# Patient Record
Sex: Male | Born: 1975 | Race: Black or African American | Hispanic: No | Marital: Married | Smoking: Current every day smoker
Health system: Southern US, Community
[De-identification: ages and names within clinical notes are randomized; demographics above are authoritative.]

---

## 2005-12-29 ENCOUNTER — Emergency Department (HOSPITAL_COMMUNITY): Admission: EM | Admit: 2005-12-29 | Discharge: 2005-12-29 | Payer: Self-pay | Admitting: Emergency Medicine
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
## 2014-01-02 ENCOUNTER — Encounter: Payer: Self-pay | Admitting: Physical Medicine & Rehabilitation

## 2014-02-03 ENCOUNTER — Ambulatory Visit (HOSPITAL_BASED_OUTPATIENT_CLINIC_OR_DEPARTMENT_OTHER): Payer: Worker's Compensation | Admitting: Physical Medicine & Rehabilitation

## 2014-02-03 ENCOUNTER — Encounter: Payer: Self-pay | Admitting: Physical Medicine & Rehabilitation

## 2014-02-03 ENCOUNTER — Other Ambulatory Visit: Payer: Self-pay | Admitting: Physical Medicine & Rehabilitation

## 2014-02-03 ENCOUNTER — Encounter: Payer: Worker's Compensation | Attending: Physical Medicine & Rehabilitation

## 2014-02-03 DIAGNOSIS — Z5181 Encounter for therapeutic drug level monitoring: Secondary | ICD-10-CM | POA: Insufficient documentation

## 2014-02-03 DIAGNOSIS — M545 Low back pain: Secondary | ICD-10-CM | POA: Insufficient documentation

## 2014-02-03 DIAGNOSIS — Z79899 Other long term (current) drug therapy: Secondary | ICD-10-CM | POA: Insufficient documentation

## 2014-02-03 DIAGNOSIS — M533 Sacrococcygeal disorders, not elsewhere classified: Secondary | ICD-10-CM | POA: Diagnosis present

## 2014-02-03 DIAGNOSIS — G8929 Other chronic pain: Secondary | ICD-10-CM | POA: Insufficient documentation

## 2014-02-03 MED ORDER — CYCLOBENZAPRINE HCL 10 MG PO TABS: 10.0000 mg | ORAL_TABLET | Freq: Every day | ORAL | Status: AC

## 2014-02-03 NOTE — Progress Notes (Signed)
Subjective:    Patient ID: Roy Porter, male    DOB: 02/27/1976, 38 y.o.   MRN: [id number] Work-related injury Date of injury 07/20/2013 Chief complaint low back pain HPI Slipped and fell at work. He went to occupational medicine the following day. Patient hit his hip against the floor started on diclofenac and Flexeril x-rays performed of the hip and low back. Left hip x-ray was normal. Was taken off of work for several days. Reevaluated by occupational medicine. No sciatic symptoms were noted. Referred to orthopedic surgery. Also was sent to physical therapy. X-ray of the lumbar spine was normal. Orthopedic evaluation from 10/09/2013 reviewed. Therapy was about complete by that time. Was allowed light duty work. Continued on cyclobenzaprine. MRI of the lumbar spine was ordered by orthopedic surgery. Demonstrating normal levels T12-L5. There was mild disc desiccation small annular fissure at L5-S1. Small central disc protrusion abutting right S1 nerve root. Underwent FCE on 11/14/2013. Tested to light physical demand level 20 pound lifting floor to waist 20 pounds waist to shoulder carry 30 pounds which 107 pounds pulse 61 pounds. Aerobic functional capacity 2.65 Mets. Accurate FCE.  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 Carries hands weighing about 15 pounds.  Patient complains of numbness in both legs. He also complains of arm numbness which did discuss that I can only treat for low back related issues.   Past medical history negative for prior back issues, negative for psoriasis, negative for bowel or bladder issues  Past surgical history negative for back surgeries in the past, no hip or knee surgeries.  Standing tolerance 2 hr Pain Inventory Average Pain 8 Pain Right Now 8 My pain is intermittent, sharp, stabbing, tingling and aching  In the last 24 hours, has pain interfered with the following? General activity 6 Relation with others 6 Enjoyment of life 6 What TIME of  day is your pain at its worst? morning and night Sleep (in general) Poor  Pain is worse with: walking, bending, sitting, standing and some activites Pain improves with: gets no relief Relief from Meds: 0  Mobility walk without assistance how many minutes can you walk? 60 minutes ability to climb steps?  yes do you drive?  yes  Function not employed: date last employed 08/10/2013  Neuro/Psych weakness numbness tingling spasms  Prior Studies Any changes since last visit?  yes  Physicians involved in your care Any changes since last visit?  yes   History reviewed. No pertinent family history. History   Social History  . Marital Status: Married    Spouse Name: N/A    Number of Children: N/A  . Years of Education: N/A   Social History Main Topics  . Smoking status: Former Smoker -- 0.50 packs/day for 22 years    Types: Cigarettes  . Smokeless tobacco: Never Used  . Alcohol Use: None  . Drug Use: None  . Sexual Activity: None   Other Topics Concern  . None   Social History Narrative  . None   History reviewed. No pertinent past surgical history. History reviewed. No pertinent past medical history. Pulse 86  Resp 14  Ht 5\' 10"  (1.778 m)  Wt 223 lb (101.152 kg)  BMI 32.00 kg/m2  SpO2 98%  Opioid Risk Score:   Fall Risk Score: Low Fall Risk (0-5 points)  Review of Systems     Objective:   Physical Exam  Constitutional: He is oriented to person, place, and time. He appears well-developed and well-nourished.  Musculoskeletal:  Right hip: He exhibits decreased range of motion. He exhibits no tenderness and no deformity.       Left hip: He exhibits decreased range of motion. He exhibits normal strength and no deformity.       Lumbar back: He exhibits decreased range of motion and tenderness. He exhibits no deformity and no spasm.  Neurological: He is alert and oriented to person, place, and time. He has normal strength and normal reflexes. He displays  no atrophy. No sensory deficit. Gait normal.  Reflex Scores:      Patellar reflexes are 2+ on the right side and 2+ on the left side.      Achilles reflexes are 2+ on the right side and 2+ on the left side. Psychiatric: He has a normal mood and affect.  Nursing note and vitals reviewed.  Positive Fabere maneuver on the right side in the right SI joint area Negative straight leg raising test Sensation intact to pinprick and proprioception in both lower limbs Ambulates without evidence of toe drag or knee instability. Alternate sitting with standing during visit       Assessment & Plan:  1. Chronic low back pain 7 months after a fall at work. MRI showing some degenerative disc. No clear cut signs of radiculopathy. Has vague symptoms of numbness but no sensory loss on examination. 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 This could've been caused by the fall. He has no other risk factors for sacroiliitis such as inflammatory bowel disease or psoriasis.  Recommend sacroiliac injection. We will assess efficacy. If helpful may need to repeat and even proceed onto sacroiliac radiofrequency.  If Sacroiliac injection not helpful Would recommend right S1 transforaminal injection under fluoroscopic guidance. If this is helpful may need to repeat.  If right S1 transforaminal injection is not helpful then would proceed to right L3 L4 L5 medial branch blocks under fluoroscopic guidance.  Sleep disturbance secondary to chronic pain we'll prescribe cyclobenzaprine 10 mg daily at bedtime. May need to increase to 20 if this is not helpful  Discussed with patient, his wife as well as his case Production designer, theatre/television/filmmanager. Wayne Severara Ramos fax [phone]  Case manager Joellen JerseyMary Baldwin Fax [phone]

## 2014-02-03 NOTE — Patient Instructions (Signed)
Prescribed cyclobenzaprine 10 mg daily at bedtime to help with muscle spasms as well as sleep  First injection right sacroiliac joint  If helpful may need to repeat  If not helpful recommend right S1 transforaminal epidural steroid injection  If helpful may need to repeat  If not helpful recommend right L3, L4, L5 medial branch blocks

## 2014-02-04 LAB — PMP ALCOHOL METABOLITE (ETG): ETGU: NEGATIVE ng/mL

## 2014-02-05 LAB — COCAINE METABOLITE (GC/LC/MS), URINE: BENZOYLECGONINE GC/MS CONF: 527 ng/mL — AB (ref <100)

## 2014-02-06 LAB — PRESCRIPTION MONITORING PROFILE (SOLSTAS)
Amphetamine/Meth: NEGATIVE ng/mL
BUPRENORPHINE, URINE: NEGATIVE ng/mL
Barbiturate Screen, Urine: NEGATIVE ng/mL
Benzodiazepine Screen, Urine: NEGATIVE ng/mL
CANNABINOID SCRN UR: NEGATIVE ng/mL
CREATININE, URINE: 205.99 mg/dL (ref >20.0)
Carisoprodol, Urine: NEGATIVE ng/mL
ECSTASY: NEGATIVE ng/mL
FENTANYL URINE: NEGATIVE ng/mL
MEPERIDINE UR: NEGATIVE ng/mL
METHADONE SCREEN, URINE: NEGATIVE ng/mL
NITRITES URINE, INITIAL: NEGATIVE ug/mL
Opiate Screen, Urine: NEGATIVE ng/mL
Oxycodone Screen, Ur: NEGATIVE ng/mL
PROPOXYPHENE: NEGATIVE ng/mL
Tapentadol, urine: NEGATIVE ng/mL
Tramadol Scrn, Ur: NEGATIVE ng/mL
Zolpidem, Urine: NEGATIVE ng/mL
pH, Initial: 6.8 pH (ref 4.5–8.9)

## 2014-02-20 ENCOUNTER — Telehealth: Payer: Self-pay | Admitting: *Deleted

## 2014-02-20 NOTE — Telephone Encounter (Signed)
Notified Mr Kathlen BrunswickLipscomb that due to the + cocaine in his UDS we will not prescribe medications for him but Dr Wynn BankerKirsteins will do injections only on him.

## 2014-02-24 ENCOUNTER — Ambulatory Visit: Payer: Worker's Compensation | Admitting: Physical Medicine & Rehabilitation

## 2014-03-03 ENCOUNTER — Ambulatory Visit: Payer: Worker's Compensation | Admitting: Physical Medicine & Rehabilitation

## 2014-03-10 ENCOUNTER — Encounter: Payer: Self-pay | Admitting: Physical Medicine & Rehabilitation

## 2014-03-10 ENCOUNTER — Encounter: Payer: Worker's Compensation | Attending: Physical Medicine & Rehabilitation

## 2014-03-10 ENCOUNTER — Ambulatory Visit (HOSPITAL_BASED_OUTPATIENT_CLINIC_OR_DEPARTMENT_OTHER): Payer: Worker's Compensation | Admitting: Physical Medicine & Rehabilitation

## 2014-03-10 VITALS — BP 140/86 | HR 92 | Resp 14 | Ht 70.0 in | Wt 224.0 lb

## 2014-03-10 DIAGNOSIS — M533 Sacrococcygeal disorders, not elsewhere classified: Secondary | ICD-10-CM

## 2014-03-10 NOTE — Patient Instructions (Signed)
Sacroiliac injection was performed today. A combination of a naming medicine plus a cortisone medicine was injected. The injection was done under x-ray guidance. This procedure has been performed to help reduce low back and buttocks pain as well as potentially hip pain. The duration of this injection is variable lasting from hours to  Months. It may repeated if needed.  May resume work tomorrow  If this is not helpful next step is Right S 1 transforaminal Epidural injection

## 2014-03-10 NOTE — Progress Notes (Signed)
Right sacroiliac injection under fluoroscopic guidance  Indication: Right Low back and buttocks pain not relieved by medication management and other conservative care.  Informed consent was obtained after describing risks and benefits of the procedure with the patient, this includes bleeding, bruising, infection, paralysis and medication side effects. The patient wishes to proceed and has given written consent. The patient was placed in a prone position. The lumbar and sacral area was marked and prepped with Betadine. A 25-gauge 1-1/2 inch needle was inserted into the skin and subcutaneous tissue and 1 mL of 1% lidocaine was injected. Then a 25-gauge 3 inch spinal needle was inserted under fluoroscopic guidance into the Right sacroiliac joint. AP and lateral images were utilized. Omnipaque 180x0.5 mL under live fluoroscopy demonstrated no intravascular uptake. Then a solution containing one ML of 6 mgper ml betamethasone and 2 ML of 1% lidocaine MPF was injected x1.5 mL. Patient tolerated the procedure well. Post procedure instructions were given. Please see post procedure form.  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 His work restrictions have been previously determined by [employer]. Continue current pain medication which is cyclobenzaprine 10 mg Daily at bedtime when necessary  Also discussed next steps in terms of this procedure. If this procedure was helpful but only short-term would repeat, if not helpful at all consider right S1 transforaminal epidural steroid injection  Return to clinic 5 weeks

## 2014-03-10 NOTE — Progress Notes (Signed)
  PROCEDURE RECORD [facility]   Name: Roy Porter DOB:02/09/1976 MRN: [id number]  Date:03/10/2014  Physician: Claudette LawsAndrew Kirsteins, MD    Nurse/CMA: Jenel Gierke  Allergies: KNDA  Consent Signed: Yes.    Is patient diabetic? No.  CBG today? .  Pregnant: No. LMP: No LMP for male patient. (age 618-55)  Anticoagulants: no Anti-inflammatory: no Antibiotics: no  Procedure: Rigth Sacroiliac Inject Position: Prone 6Start Time: 4:05 End Time: 4:09 Fluoro Time: 8  RN/CMA Draiden Mirsky Wing Schoch    Time 3:45pm 4:11pm    BP 144/80 140/80    Pulse 88 76    Respirations 14 14    O2 Sat 99 99    S/S 6 6    Pain Level 6/10 5/10     D/C home with wife, patient A & O X 3, D/C instructions reviewed, and sits independently.

## 2014-04-17 ENCOUNTER — Ambulatory Visit: Payer: Self-pay | Admitting: Physical Medicine & Rehabilitation

## 2014-04-21 ENCOUNTER — Ambulatory Visit (HOSPITAL_BASED_OUTPATIENT_CLINIC_OR_DEPARTMENT_OTHER): Payer: Worker's Compensation | Admitting: Physical Medicine & Rehabilitation

## 2014-04-21 ENCOUNTER — Encounter: Payer: Self-pay | Admitting: Physical Medicine & Rehabilitation

## 2014-04-21 ENCOUNTER — Encounter: Payer: Worker's Compensation | Attending: Physical Medicine & Rehabilitation

## 2014-04-21 VITALS — BP 125/91 | HR 82 | Resp 14

## 2014-04-21 DIAGNOSIS — G8929 Other chronic pain: Secondary | ICD-10-CM | POA: Insufficient documentation

## 2014-04-21 DIAGNOSIS — M533 Sacrococcygeal disorders, not elsewhere classified: Secondary | ICD-10-CM | POA: Insufficient documentation

## 2014-04-21 DIAGNOSIS — Z79899 Other long term (current) drug therapy: Secondary | ICD-10-CM | POA: Diagnosis not present

## 2014-04-21 DIAGNOSIS — M545 Low back pain: Secondary | ICD-10-CM | POA: Insufficient documentation

## 2014-04-21 DIAGNOSIS — Z5181 Encounter for therapeutic drug level monitoring: Secondary | ICD-10-CM | POA: Insufficient documentation

## 2014-04-21 MED ORDER — MELOXICAM 7.5 MG PO TABS: 7.5000 mg | ORAL_TABLET | Freq: Every day | ORAL | Status: AC

## 2014-04-21 NOTE — Patient Instructions (Signed)
Sacroiliac injection was performed today. A combination of a naming medicine plus a cortisone medicine was injected. The injection was done under x-ray guidance. This procedure has been performed to help reduce low back and buttocks pain as well as potentially hip pain. The duration of this injection is variable lasting from hours to  Months. It may repeated if needed. 

## 2014-04-21 NOTE — Progress Notes (Signed)
  PROCEDURE RECORD [facility]   Name: Robina Adeerrance Bejarano DOB:02/10/1976 MRN: [id number]  Date:04/21/2014  Physician: Claudette LawsAndrew Kirsteins, MD    Nurse/CMA:Ken Amour Trigg  Allergies: Not on File  Consent Signed: Yes.    Is patient diabetic? No.  CBG today?   Pregnant: No. LMP: No LMP for male patient. (age 39-55)  Anticoagulants: no Anti-inflammatory: no Antibiotics: no  Procedure: Right SI  Steroid injection     Position: Prone Start Time: 3:36 pm End Time: 3:43PM  Fluoro Time: 18  RN/CMA Teresa CoombsKen [place] Ken Tahnee Cifuentes    Time 3:00PM 3:50 pm    BP 125/81 146/79    Pulse 82 75    Respirations 14 14    O2 Sat 97 98    S/S 6 6    Pain Level 7/10 3/10     D/C home with transportation service ( drivers name is Roo, patient A & O X 3, D/C instructions reviewed, and sits independently.

## 2014-04-21 NOTE — Progress Notes (Signed)
Right sacroiliac injection under fluoroscopic guidance  Indication: Right Low back and buttocks pain not relieved by medication management and other conservative care.Last injection gave close to 100% relief for several days.  Informed consent was obtained after describing risks and benefits of the procedure with the patient, this includes bleeding, bruising, infection, paralysis and medication side effects. The patient wishes to proceed and has given written consent. The patient was placed in a prone position. The lumbar and sacral area was marked and prepped with Betadine. A 25-gauge 1-1/2 inch needle was inserted into the skin and subcutaneous tissue and 1 mL of 1% lidocaine was injected. Then a 25-gauge 3 inch spinal needle was inserted under fluoroscopic guidance into the Right sacroiliac joint. AP and lateral images were utilized. Omnipaque 180x0.5 mL under live fluoroscopy demonstrated no intravascular uptake. Then a solution containing one ML of 6 mgper ml betamethasone and 2 ML of 1% lidocaine MPF was injected x1.5 mL. Patient tolerated the procedure well. Post procedure instructions were given. Please see post procedure form.  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 His work restrictions have been previously determined by [employer]. Continue current pain medication which is cyclobenzaprine 10 mg Daily at bedtime when necessary  Also discussed next steps in terms of this procedure. If this procedure was helpful but only short-term would repeat, if not helpful at all consider right S1 transforaminal epidural steroid injection  Return to clinic 5 weeks

## 2014-05-12 ENCOUNTER — Ambulatory Visit (HOSPITAL_BASED_OUTPATIENT_CLINIC_OR_DEPARTMENT_OTHER): Payer: Worker's Compensation | Admitting: Physical Medicine & Rehabilitation

## 2014-05-12 ENCOUNTER — Encounter: Payer: Worker's Compensation | Attending: Physical Medicine & Rehabilitation

## 2014-05-12 ENCOUNTER — Encounter: Payer: Self-pay | Admitting: Physical Medicine & Rehabilitation

## 2014-05-12 VITALS — BP 143/86 | HR 98 | Resp 14

## 2014-05-12 DIAGNOSIS — Z5181 Encounter for therapeutic drug level monitoring: Secondary | ICD-10-CM | POA: Diagnosis not present

## 2014-05-12 DIAGNOSIS — M533 Sacrococcygeal disorders, not elsewhere classified: Secondary | ICD-10-CM

## 2014-05-12 DIAGNOSIS — G8929 Other chronic pain: Secondary | ICD-10-CM | POA: Insufficient documentation

## 2014-05-12 DIAGNOSIS — M545 Low back pain: Secondary | ICD-10-CM

## 2014-05-12 DIAGNOSIS — Z79899 Other long term (current) drug therapy: Secondary | ICD-10-CM | POA: Insufficient documentation

## 2014-05-12 NOTE — Patient Instructions (Signed)
Recommend right sacroiliac radiofrequency procedure  Continue current work restrictions which are based on the functional capacity evaluation

## 2014-05-12 NOTE — Progress Notes (Signed)
Subjective:    Patient ID: Roy Porter, male    DOB: 07-18-1975, 39 y.o.   MRN: [id number] Work-related injury Date of injury 07/20/2013 Chief complaint low back pain HPI Slipped and fell at work. He went to occupational medicine the following day. Patient hit his hip against the floor started on diclofenac and Flexeril x-rays performed of the hip and low back. Left hip x-ray was normal. Was taken off of work for several days. Reevaluated by occupational medicine. No sciatic symptoms were noted. Referred to orthopedic surgery. Also was sent to physical therapy. X-ray of the lumbar spine was normal. Orthopedic evaluation from 10/09/2013 reviewed. Therapy was about complete by that time. Was allowed light duty work. Continued on cyclobenzaprine. MRI of the lumbar spine was ordered by orthopedic surgery. Demonstrating normal levels T12-L5. There was mild disc desiccation small annular fissure at L5-S1. Small central disc protrusion abutting right S1 nerve root. Underwent FCE on 11/14/2013. Tested to light physical demand level 20 pound lifting floor to waist 20 pounds waist to shoulder carry 30 pounds which 107 pounds pulse 61 pounds. Aerobic functional capacity 2.65 Mets. Accurate FCE.  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 HPI Excellent results from sacroiliac injections 04/21/2014-almost 100% relief for about 3 weeks 03/11/2015- approx 50-80% relief for about 2 weeks  Pain Inventory Average Pain 8 Pain Right Now 8 My pain is sharp, stabbing and tingling  In the last 24 hours, has pain interfered with the following? General activity 0 Relation with others 0 Enjoyment of life 0 What TIME of day is your pain at its worst? daytime and night Sleep (in general) Poor  Pain is worse with: bending, sitting, standing and some activites Pain improves with: injections  Last injection helped a great deal Relief from Meds: na  Mobility walk without  assistance ability to climb steps?  yes  Function employed # of hrs/week .  Neuro/Psych weakness tingling spasms  Prior Studies Any changes since last visit?  no  Physicians involved in your care Any changes since last visit?  no   History reviewed. No pertinent family history. History   Social History  . Marital Status: Married    Spouse Name: N/A    Number of Children: N/A  . Years of Education: N/A   Social History Main Topics  . Smoking status: Current Every Day Smoker -- 0.50 packs/day for 22 years    Types: Cigarettes  . Smokeless tobacco: Never Used  . Alcohol Use: None  . Drug Use: None  . Sexual Activity: None   Other Topics Concern  . None   Social History Narrative   History reviewed. No pertinent past surgical history. History reviewed. No pertinent past medical history. BP 143/86 mmHg  Pulse 98  Resp 14  SpO2 97%  Opioid Risk Score:   Fall Risk Score: Low Fall Risk (0-5 points)  Review of Systems  Constitutional: Positive for unexpected weight change.  Musculoskeletal:       Spasms  Neurological: Positive for weakness.       Tingling  All other systems reviewed and are negative.      Objective:   Physical Exam  Constitutional: He is oriented to person, place, and time. He appears well-developed and well-nourished.  HENT:  Head: Normocephalic and atraumatic.  Eyes: Conjunctivae and EOM are normal. Pupils are equal, round, and reactive to light.  Neurological: He is alert and oriented to person, place, and time.  Psychiatric:  He has a normal mood and affect.  Nursing note and vitals reviewed.   Tenderness palpation right PSIS Negative straight leg raising test Lumbar flexion is 75% extension 75% lateral bending is 50% accompanied by pain mainly towards the right side Gait without toe drag or knee instability      Assessment & Plan:  1. Right sacroiliac dysfunction, greater than 50% relief from sacroiliac injections on at least 2  occasions. Pain is now getting back to baseline and severe. We will schedule right sacroiliac RF which will consist of right L4 medial branch ,right L5,S1, S2, S3 dorsal ramus,Radiofrequency ablations  Discussed with patient as well as his case Production designer, theatre/television/filmmanager. We also discussed possibility that  Innervation to the sacroiliac can be in part from ventral ramus which would reduce the efficacy of the technique

## 2014-06-11 ENCOUNTER — Ambulatory Visit: Payer: Self-pay | Admitting: Physical Medicine & Rehabilitation

## 2014-06-16 ENCOUNTER — Encounter: Payer: Worker's Compensation | Attending: Physical Medicine & Rehabilitation

## 2014-06-16 ENCOUNTER — Ambulatory Visit: Payer: Worker's Compensation | Admitting: Physical Medicine & Rehabilitation

## 2014-06-16 DIAGNOSIS — G8929 Other chronic pain: Secondary | ICD-10-CM | POA: Insufficient documentation

## 2014-06-16 DIAGNOSIS — M533 Sacrococcygeal disorders, not elsewhere classified: Secondary | ICD-10-CM | POA: Insufficient documentation

## 2014-06-16 DIAGNOSIS — M545 Low back pain: Secondary | ICD-10-CM | POA: Insufficient documentation

## 2014-06-16 DIAGNOSIS — Z5181 Encounter for therapeutic drug level monitoring: Secondary | ICD-10-CM | POA: Insufficient documentation

## 2014-06-16 DIAGNOSIS — Z79899 Other long term (current) drug therapy: Secondary | ICD-10-CM | POA: Insufficient documentation
